# Patient Record
Sex: Male | Born: 1975 | Race: White | Hispanic: No | Marital: Single | Smoking: Current every day smoker
Health system: Southern US, Community
[De-identification: ages and names within clinical notes are randomized; demographics above are authoritative.]

---

## 2004-07-23 ENCOUNTER — Emergency Department: Payer: Self-pay | Admitting: Emergency Medicine

## 2004-11-04 ENCOUNTER — Other Ambulatory Visit: Payer: Self-pay

## 2004-11-04 ENCOUNTER — Emergency Department: Payer: Self-pay | Admitting: Emergency Medicine

## 2006-04-28 ENCOUNTER — Emergency Department: Payer: Self-pay | Admitting: Emergency Medicine

## 2006-11-11 ENCOUNTER — Inpatient Hospital Stay: Payer: Self-pay | Admitting: Psychiatry

## 2007-06-07 ENCOUNTER — Emergency Department: Payer: Self-pay | Admitting: Emergency Medicine

## 2008-07-22 ENCOUNTER — Emergency Department: Payer: Self-pay | Admitting: Emergency Medicine

## 2010-09-02 ENCOUNTER — Emergency Department: Payer: Self-pay | Admitting: Unknown Physician Specialty

## 2011-03-13 ENCOUNTER — Emergency Department: Payer: Self-pay | Admitting: Emergency Medicine

## 2011-05-22 ENCOUNTER — Emergency Department: Payer: Self-pay | Admitting: Emergency Medicine

## 2011-05-22 LAB — COMPREHENSIVE METABOLIC PANEL
Albumin: 4.6 g/dL (ref 3.4–5.0)
Alkaline Phosphatase: 46 U/L — ABNORMAL LOW (ref 50–136)
Anion Gap: 13 (ref 7–16)
BUN: 14 mg/dL (ref 7–18)
Calcium, Total: 8.7 mg/dL (ref 8.5–10.1)
Co2: 25 mmol/L (ref 21–32)
Creatinine: 0.84 mg/dL (ref 0.60–1.30)
EGFR (African American): >60
Glucose: 81 mg/dL (ref 65–99)
SGOT(AST): 31 U/L (ref 15–37)
SGPT (ALT): 21 U/L
Sodium: 145 mmol/L (ref 136–145)

## 2011-05-22 LAB — ETHANOL
Ethanol %: <0.003 % (ref 0.000–0.080)
Ethanol: <3 mg/dL

## 2011-05-22 LAB — CBC
HCT: 39 % — ABNORMAL LOW (ref 40.0–52.0)
HGB: 13.3 g/dL (ref 13.0–18.0)
MCHC: 34 g/dL (ref 32.0–36.0)
MCV: 95 fL (ref 80–100)
Platelet: 324 10*3/uL (ref 150–440)
WBC: 9.8 10*3/uL (ref 3.8–10.6)

## 2011-05-25 ENCOUNTER — Emergency Department: Payer: Self-pay | Admitting: Emergency Medicine

## 2011-05-25 LAB — TROPONIN I: Troponin-I: <0.02 ng/mL

## 2011-05-25 LAB — CBC
MCHC: 34.4 g/dL (ref 32.0–36.0)
MCV: 94 fL (ref 80–100)
Platelet: 299 10*3/uL (ref 150–440)
RBC: 3.89 10*6/uL — ABNORMAL LOW (ref 4.40–5.90)
WBC: 6.9 10*3/uL (ref 3.8–10.6)

## 2011-05-25 LAB — COMPREHENSIVE METABOLIC PANEL
Albumin: 4.1 g/dL (ref 3.4–5.0)
Calcium, Total: 8.5 mg/dL (ref 8.5–10.1)
Chloride: 106 mmol/L (ref 98–107)
Co2: 27 mmol/L (ref 21–32)
Creatinine: 0.82 mg/dL (ref 0.60–1.30)
EGFR (Non-African Amer.): >60
Glucose: 134 mg/dL — ABNORMAL HIGH (ref 65–99)
Osmolality: 286 (ref 275–301)
Potassium: 3.8 mmol/L (ref 3.5–5.1)
SGOT(AST): 30 U/L (ref 15–37)
SGPT (ALT): 21 U/L
Sodium: 143 mmol/L (ref 136–145)

## 2011-06-03 ENCOUNTER — Emergency Department: Payer: Self-pay | Admitting: Emergency Medicine

## 2011-07-04 ENCOUNTER — Emergency Department: Payer: Self-pay | Admitting: Emergency Medicine

## 2011-07-04 LAB — COMPREHENSIVE METABOLIC PANEL
Alkaline Phosphatase: 75 U/L (ref 50–136)
Calcium, Total: 9.1 mg/dL (ref 8.5–10.1)
Chloride: 107 mmol/L (ref 98–107)
Co2: 23 mmol/L (ref 21–32)
Glucose: 70 mg/dL (ref 65–99)
SGOT(AST): 34 U/L (ref 15–37)
SGPT (ALT): 24 U/L

## 2011-07-04 LAB — URINALYSIS, COMPLETE
Bilirubin,UR: NEGATIVE
Glucose,UR: NEGATIVE mg/dL (ref 0–75)
Protein: 30

## 2011-07-04 LAB — DRUG SCREEN, URINE
Barbiturates, Ur Screen: NEGATIVE (ref ?–200)
Cocaine Metabolite,Ur ~~LOC~~: NEGATIVE (ref ?–300)
MDMA (Ecstasy)Ur Screen: NEGATIVE (ref ?–500)
Methadone, Ur Screen: NEGATIVE (ref ?–300)
Phencyclidine (PCP) Ur S: NEGATIVE (ref ?–25)
Tricyclic, Ur Screen: NEGATIVE (ref ?–1000)

## 2011-07-04 LAB — ETHANOL: Ethanol: <3 mg/dL

## 2011-07-04 LAB — TSH: Thyroid Stimulating Horm: 0.75 u[IU]/mL

## 2011-07-05 LAB — CBC
MCH: 31.8 pg (ref 26.0–34.0)
MCHC: 33.8 g/dL (ref 32.0–36.0)
MCV: 94 fL (ref 80–100)
Platelet: 351 10*3/uL (ref 150–440)
RBC: 4.53 10*6/uL (ref 4.40–5.90)
WBC: 13.2 10*3/uL — ABNORMAL HIGH (ref 3.8–10.6)

## 2013-05-29 ENCOUNTER — Emergency Department: Payer: Self-pay | Admitting: Emergency Medicine

## 2014-06-23 NOTE — Consult Note (Signed)
Brief Consult Note: Diagnosis: Mood disorder NOS, PTSD.   Patient was seen by consultant.   Consult note dictated.   Recommend further assessment or treatment.   Orders entered.   Discussed with Attending MD.   Comments: [name] has a h/o anxiety and mood instability. He was brought to the hospital after a suicidal gesture by putting a toy gun to his head to impress his girlfriend who wants to separate. He is no longer suicidal, homicidal., agitated or excessively anxious. He is able to contract for safety.  PLAN: 1. The patient no longer meets criteria for IVC. I will termonate proceedings. Please discharge as appropriate.   2. I will start Tegretol for mood stabilization, Trazodone for sleep and BuSpar for anxiety as it was helpful in the past. Rx written.  3. [facility] Nevin BloodgoodKathy Swansie.  Electronic Signatures: Kristine LineaPucilowska, Latrina Guttman (MD)  (Signed 06-May-13 11:00)  Authored: Brief Consult Note   Last Updated: 06-May-13 11:00 by Kristine LineaPucilowska, Antoniette Peake (MD)

## 2016-03-10 ENCOUNTER — Emergency Department
Admission: EM | Admit: 2016-03-10 | Discharge: 2016-03-10 | Discharge status: Against Medical Advice | Payer: Self-pay | Attending: Student in an Organized Health Care Education/Training Program | Admitting: Student in an Organized Health Care Education/Training Program

## 2016-03-10 ENCOUNTER — Encounter: Payer: Self-pay | Admitting: Emergency Medicine

## 2016-03-10 ENCOUNTER — Emergency Department: Payer: Self-pay

## 2016-03-10 DIAGNOSIS — I959 Hypotension, unspecified: Secondary | ICD-10-CM | POA: Insufficient documentation

## 2016-03-10 DIAGNOSIS — R519 Headache, unspecified: Secondary | ICD-10-CM

## 2016-03-10 DIAGNOSIS — F17210 Nicotine dependence, cigarettes, uncomplicated: Secondary | ICD-10-CM | POA: Insufficient documentation

## 2016-03-10 DIAGNOSIS — R569 Unspecified convulsions: Secondary | ICD-10-CM | POA: Insufficient documentation

## 2016-03-10 DIAGNOSIS — R51 Headache: Secondary | ICD-10-CM | POA: Insufficient documentation

## 2016-03-10 LAB — BASIC METABOLIC PANEL
Anion gap: 7 (ref 5–15)
BUN: 18 mg/dL (ref 6–20)
CALCIUM: 9.3 mg/dL (ref 8.9–10.3)
CO2: 27 mmol/L (ref 22–32)
Chloride: 104 mmol/L (ref 101–111)
Creatinine, Ser: 1.2 mg/dL (ref 0.61–1.24)
GFR calc Af Amer: >60 mL/min (ref >60)
GLUCOSE: 149 mg/dL — AB (ref 65–99)
POTASSIUM: 3.9 mmol/L (ref 3.5–5.1)
Sodium: 138 mmol/L (ref 135–145)

## 2016-03-10 LAB — CBC
HEMATOCRIT: 40.8 % (ref 40.0–52.0)
Hemoglobin: 14 g/dL (ref 13.0–18.0)
MCH: 30.7 pg (ref 26.0–34.0)
MCHC: 34.4 g/dL (ref 32.0–36.0)
MCV: 89.3 fL (ref 80.0–100.0)
Platelets: 384 10*3/uL (ref 150–440)
RBC: 4.57 MIL/uL (ref 4.40–5.90)
RDW: 13.3 % (ref 11.5–14.5)
WBC: 25.5 10*3/uL — ABNORMAL HIGH (ref 3.8–10.6)

## 2016-03-10 MED ORDER — SODIUM CHLORIDE 0.9 % IV BOLUS (SEPSIS): 1000.0000 mL | Freq: Once | INTRAVENOUS | Status: DC

## 2016-03-10 MED ORDER — KETAMINE HCL 10 MG/ML IJ SOLN: 0.2000 mg/kg | Freq: Once | INTRAMUSCULAR | Status: DC

## 2016-03-10 MED ORDER — LEVOFLOXACIN 750 MG PO TABS: 750.0000 mg | ORAL_TABLET | Freq: Every day | ORAL | 0 refills | Status: AC

## 2016-03-10 MED ORDER — SODIUM CHLORIDE 0.9 % IV BOLUS (SEPSIS)
1000.0000 mL | Freq: Once | INTRAVENOUS | Status: AC
  Administered 2016-03-10: 1000 mL via INTRAVENOUS

## 2016-03-10 MED ORDER — BUPIVACAINE HCL 0.25 % IJ SOLN
30.0000 mL | Freq: Once | INTRAMUSCULAR | Status: DC
  Filled 2016-03-10: qty 30

## 2016-03-10 MED ORDER — ACETAMINOPHEN 500 MG PO TABS
1000.0000 mg | ORAL_TABLET | Freq: Once | ORAL | Status: AC
  Administered 2016-03-10: 1000 mg via ORAL
  Filled 2016-03-10: qty 2

## 2016-03-10 MED ORDER — FENTANYL CITRATE (PF) 100 MCG/2ML IJ SOLN: 100.0000 ug | INTRAMUSCULAR | Status: DC | PRN

## 2016-03-10 MED ORDER — LEVOFLOXACIN 750 MG PO TABS
750.0000 mg | ORAL_TABLET | Freq: Once | ORAL | Status: AC
  Administered 2016-03-10: 750 mg via ORAL
  Filled 2016-03-10: qty 1

## 2016-03-10 MED ORDER — PROCHLORPERAZINE EDISYLATE 5 MG/ML IJ SOLN
10.0000 mg | Freq: Once | INTRAMUSCULAR | Status: AC
  Administered 2016-03-10: 10 mg via INTRAVENOUS
  Filled 2016-03-10: qty 2

## 2016-03-10 MED ORDER — BUPIVACAINE HCL (PF) 0.25 % IJ SOLN
INTRAMUSCULAR | Status: AC
  Filled 2016-03-10: qty 30

## 2016-03-10 NOTE — Discharge Instructions (Signed)
Please return if you change your mind regarding desire for medical care.  Please fololow up with PCP.    As discussed in the emergency department, you may use Tylenol and/or Ibuprofen for headaches. These are "Over the Counter" medications and can be found at most drug stores and grocery stores. Please use the recommended dosing instructions on the bottle/box. Do not exceed the maximum dose for either medications. Please be sure to rest and drink plenty of fluids. Please be sure to call your PCP for a follow-up visit, especially if your headaches persist.  Please call your physician or return to ED if you have: 1. Worsening or change in headaches. 2. Changes in vision. 3. New-onset nausea and vomiting. 4. Numbness, tingling, weakness in your extremities,. 4. Inability to eat or drink adequate amounts of food or liquids. 5. Chest pain, shortness of breath, or difficulty breathing. 6. Neurological changes- dizziness, fainting, loss of function of your arms, legs or other parts of your body. 7. Uncontrolled hypertension. 8. Or any other emergent concerns.

## 2016-03-10 NOTE — ED Triage Notes (Addendum)
Pt to triage via w/c, appears uncomfortable, c/o generalized HA; denies any recent illness; st had some dizziness today; wife re ports pt was driving, had to pull over and wife st he got out of care, fell down with ?seizure like activity; denies hx of same; st hx testicular CA; st his BP was 80 systolic; charge nurse notified and pt taken immed to room 7 by Kennedy Buckerhanh, EDT for further evaluation

## 2016-03-10 NOTE — ED Notes (Signed)
Pt verbalized desire to leave AMA. Risks described in detail to pt. Pt weak at this time and able to ambulate but continues to have hypotension.

## 2016-03-10 NOTE — ED Notes (Signed)
Pt currently refusing flu swab at this time. MD made aware.

## 2016-03-10 NOTE — ED Provider Notes (Signed)
[facility] Emergency Department Provider Note    None    (approximate)  I have reviewed the triage vital signs and the nursing notes.   HISTORY  Chief Complaint Headache and Seizures    HPI Stuart Allen is a 41 y.o. male presents ill appearing with complaint of generalized headache as well as some dizziness earlier today. According to the wife there riding home tonight and he started feeling ill. Reported over on the side of the road. Heaving out of a car and had some seizure-like activity reported by the wife. His eyes rolled back in the back of his head and he had diffuse shaking activity for several seconds followed by drowsiness. They went home and the patient was refusing transfer to the hospital.  Wife was finally able to convince him to come. Upon arrival to the ER he is ill-appearing and poorly perfused. The pressure is hypotensive. States his primary complaint is sudden onset headache after the seizure-like activity. He denies any history of seizures. States she has a history of chronic back pain. Denies any recent drug use. Recent denies any fevers. No chills. No nausea or vomiting.   History reviewed. No pertinent past medical history. FMH: no bleeding disorders History reviewed. No pertinent surgical history. There are no active problems to display for this patient.     Prior to Admission medications   Not on File    Allergies Peanut-containing drug products and Vicodin [hydrocodone-acetaminophen]    Social History Social History  Substance Use Topics  . Smoking status: Current Every Day Smoker    Packs/day: 1.00    Types: Cigarettes  . Smokeless tobacco: Never Used  . Alcohol use No    Review of Systems Patient denies headaches, rhinorrhea, blurry vision, numbness, shortness of breath, chest pain, edema, cough, abdominal pain, nausea, vomiting, diarrhea, dysuria, fevers, rashes or hallucinations unless otherwise stated  above in HPI. ____________________________________________   PHYSICAL EXAM:  VITAL SIGNS: Vitals:   03/10/16 2149 03/10/16 2200  BP: (!) 93/53 (!) 92/39  Pulse: 63 78  Resp: 17 17  Temp:      Constitutional: Alert and oriented. Ill appearing. Eyes: Conjunctivae are normal. PERRL. EOMI. Head: Atraumatic. Nose: No congestion/rhinnorhea. Mouth/Throat: Mucous membranes are moist.  Oropharynx non-erythematous. Neck: No stridor. Painless ROM. No cervical spine tenderness to palpation Hematological/Lymphatic/Immunilogical: No cervical lymphadenopathy. Cardiovascular: Normal rate, regular rhythm. Grossly normal heart sounds.  Good peripheral circulation. Respiratory: Normal respiratory effort.  No retractions. Lungs CTAB. Gastrointestinal: Soft and nontender. No distention. No abdominal bruits. No CVA tenderness. Musculoskeletal: No lower extremity tenderness nor edema.  No joint effusions. Neurologic:  CN- intact.  No facial droop, Normal FNF.  Normal heel to shin.  Sensation intact bilaterally. Normal speech and language. No gross focal neurologic deficits are appreciated. No gait instability.  Skin:  Skin is warm, dry and intact. No rash noted. Psychiatric: Mood and affect are normal. Speech and behavior are normal.  ____________________________________________   LABS (all labs ordered are listed, but only abnormal results are displayed)  Results for orders placed or performed during the hospital encounter of 03/10/16 (from the past 24 hour(s))  Basic metabolic panel     Status: Abnormal   Collection Time: 03/10/16  8:52 PM  Result Value Ref Range   Sodium 138 135 - 145 mmol/L   Potassium 3.9 3.5 - 5.1 mmol/L   Chloride 104 101 - 111 mmol/L   CO2 27 22 - 32 mmol/L   Glucose, Bld  149 (H) 65 - 99 mg/dL   BUN 18 6 - 20 mg/dL   Creatinine, Ser 1.61 0.61 - 1.24 mg/dL   Calcium 9.3 8.9 - 09.6 mg/dL   GFR calc non Af Amer >60 >60 mL/min   GFR calc Af Amer >60 >60 mL/min   Anion  gap 7 5 - 15  CBC     Status: Abnormal   Collection Time: 03/10/16  8:52 PM  Result Value Ref Range   WBC 25.5 (H) 3.8 - 10.6 K/uL   RBC 4.57 4.40 - 5.90 MIL/uL   Hemoglobin 14.0 13.0 - 18.0 g/dL   HCT 04.5 40.9 - 81.1 %   MCV 89.3 80.0 - 100.0 fL   MCH 30.7 26.0 - 34.0 pg   MCHC 34.4 32.0 - 36.0 g/dL   RDW 91.4 78.2 - 95.6 %   Platelets 384 150 - 440 K/uL   ____________________________________________  EKG My review and personal interpretation at Time: 20:43   Indication: hypotension  Rate: 75  Rhythm: sinus Axis:  Other: non specific st changes, no STEMI ____________________________________________  RADIOLOGY  I personally reviewed all radiographic images ordered to evaluate for the above acute complaints and reviewed radiology reports and findings.  These findings were personally discussed with the patient.  Please see medical record for radiology report. ____________________________________________   PROCEDURES  Procedure(s) performed:  Procedures    Critical Care performed:  no ____________________________________________   INITIAL IMPRESSION / ASSESSMENT AND PLAN / ED COURSE  Pertinent labs & imaging results that were available during my care of the patient were reviewed by me and considered in my medical decision making (see chart for details).  DDX: sepsis, flu, meningitis, seizure, sah, sdh,  Stuart Allen is a 41 y.o. who presents to the ED with above complaints. Patient afebrile but hypertensive and ill-appearing. Does not appear to have a history of seizures. Blood work ordered to evaluate for his complaints shows markedly elevated leukocytosis but there is no metabolic acidosis. CT head ordered to evaluate for acute abnormality shows no evidence of hemorrhage or mass. No evidence of trauma. EKG shows no evidence of acute ischemia. Giving patient IV fluids and he does appear to be improving but still persistent decreased blood pressure. Have also  ordered a UDS. Discussed my concern for meningitis with the patient versus subarachnoid hemorrhage as this is the worst headache of his life. Denies any neck pain at this time and still afebrile but presentation is very bizarre. In does stated understanding of the risks and benefits of performing a lumbar puncture. He agreed to receive this diagnostic test.  In preparation for lumbar puncture to provide adequate analgesia by cleaned and then infiltrated 10 cc of Marcaine into the L3-L4 lumbar spinal space. Patient stated "that is the worst pain of over fell in my life" and told me to stop.  Clinical Course as of Mar 10 2320  Wed Mar 10, 2016  2316 I informed patient that I would be provided patient with IV pain medication and introduced into ketamine prior to attempting a lumbar puncture. Patient states that he no longer wants to be in the ER and does not want any further medical treatment at this time. Patient demonstrates understanding that failure to treat undiagnosed this condition could result and death. Patient ripped out his IV and is standing up. He is with his wife. He demonstrates good insight. Stop process does appear clear. He demonstrates understanding that leaving at this point with the concern for  meningitis or infectious process could result and death and worsening of his condition and that it is leaving AGAINST MEDICAL ADVICE. ED agree to stay for repeat vitals. He is able to ambulate and his blood pressure has improved. He is still afebrile. They saw concern for sepsis. The patient on Levaquin. Have encouraged the patient to return to the ER as soon as possible.  [PR]    Clinical Course User Index [PR] Willy Eddy, MD     ____________________________________________   FINAL CLINICAL IMPRESSION(S) / ED DIAGNOSES  Final diagnoses:  Seizure (HCC)  Acute nonintractable headache, unspecified headache type  Hypotension, unspecified hypotension type      NEW MEDICATIONS  STARTED DURING THIS VISIT:  New Prescriptions   No medications on file     Note:  This document was prepared using Dragon voice recognition software and may include unintentional dictation errors.    Willy Eddy, MD 03/10/16 [phone]

## 2016-03-10 NOTE — ED Notes (Signed)
Pt informed a urine sample is needed at this time. Sample cup provided. RN also called pharmacy and requested

## 2016-03-10 NOTE — ED Notes (Addendum)
PT taken to CT and Xray at this time.

## 2017-06-21 IMAGING — CT CT HEAD W/O CM
3 series · 16 of 46 positions shown, 19 images · non-contrast
Comparison: 05/22/2011

CLINICAL DATA: Generalized headache and dizziness

EXAM:
CT HEAD WITHOUT CONTRAST
TECHNIQUE: Contiguous axial images were obtained from the base of the skull
through the vertex without intravenous contrast.

[Series 3: head wo · axial · 0.42mm/px · z∈[+287,+407]mm · 10 of 29 slices shown, 13 images]
[im 3/29  brain]
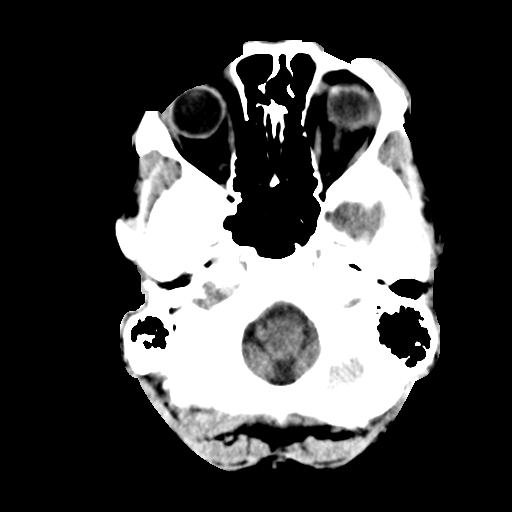
[im 3/29  bone]
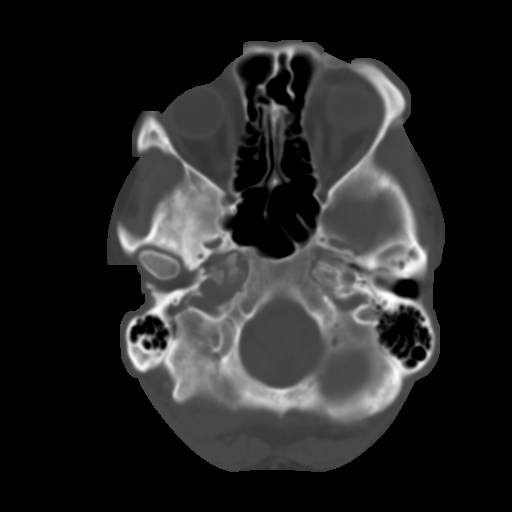
[im 6/29  brain]
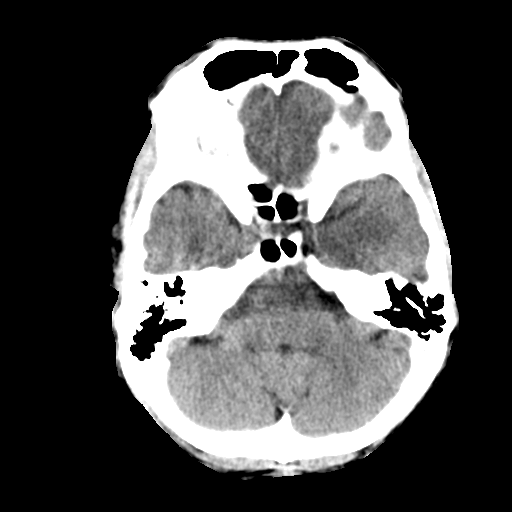
[im 8/29  brain]
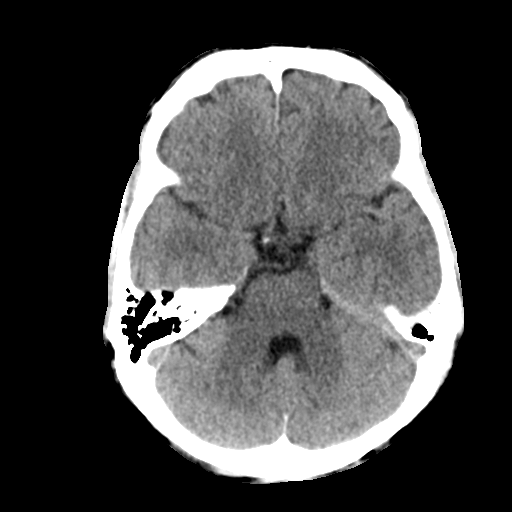
[im 11/29  brain]
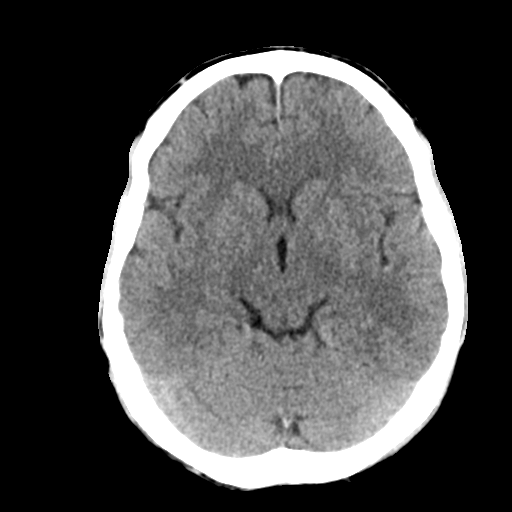
[im 14/29  brain]
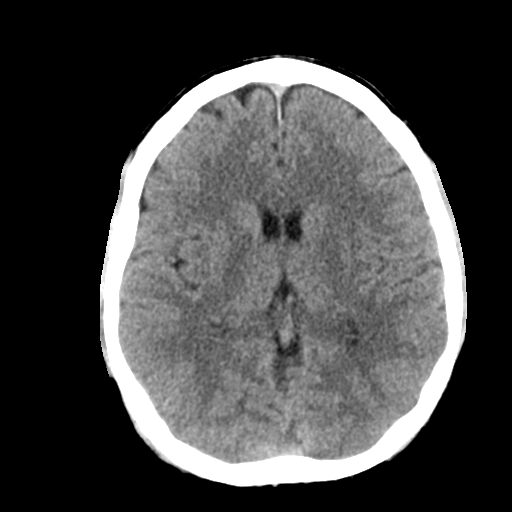
[im 14/29  bone]
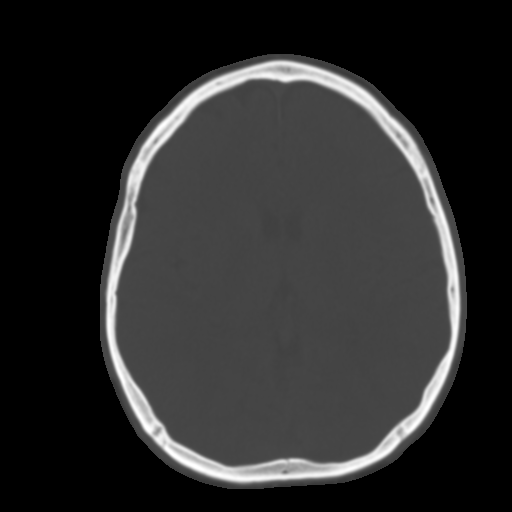
[im 16/29  brain]
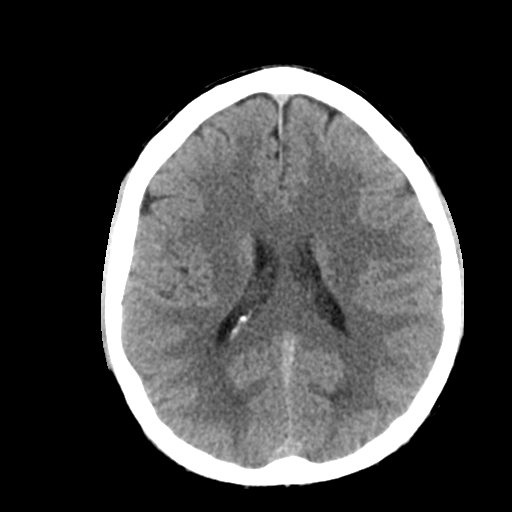
[im 19/29  brain]
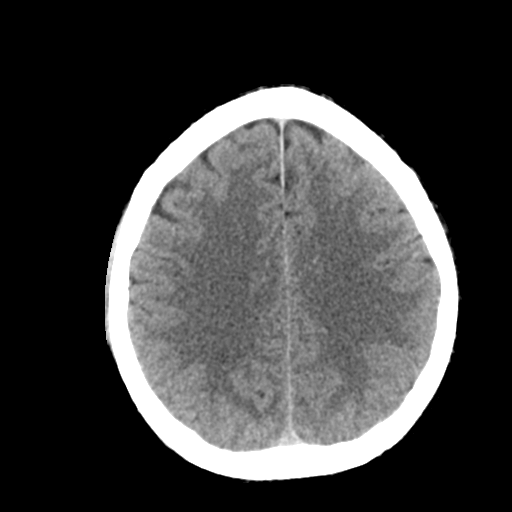
[im 22/29  brain]
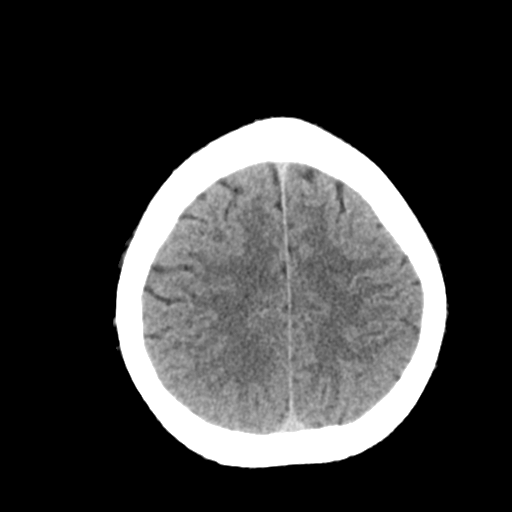
[im 24/29  brain]
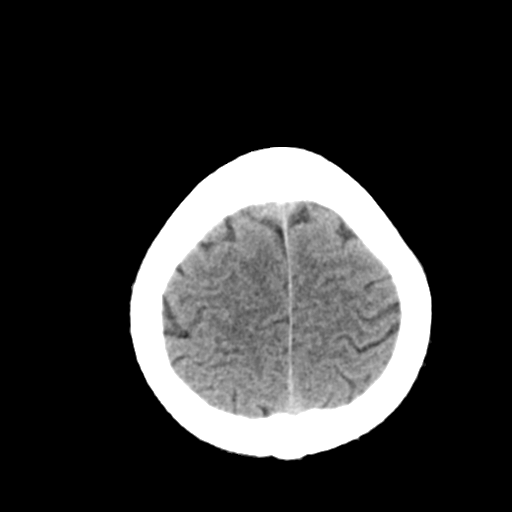
[im 24/29  bone]
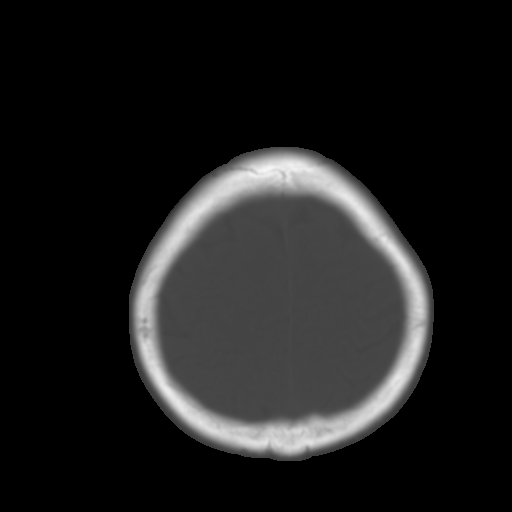
[im 27/29  brain]
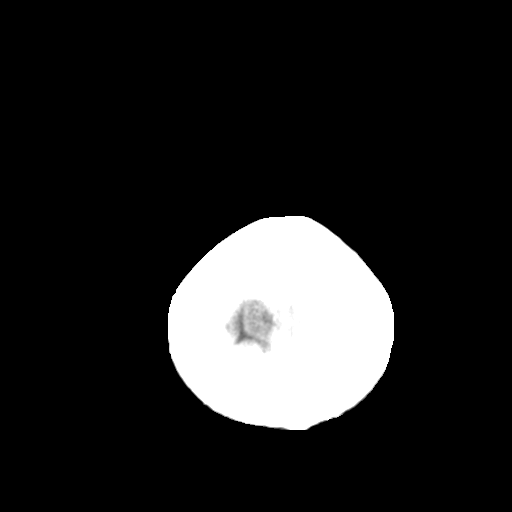

[Series 4: coronal soft tissue · coronal · 0.29mm/px · 3 of 60 slices shown]
[im 20/60  brain]
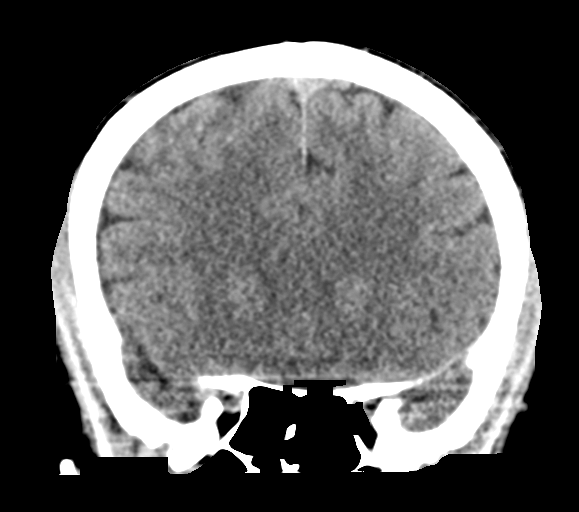
[im 27/60  brain]
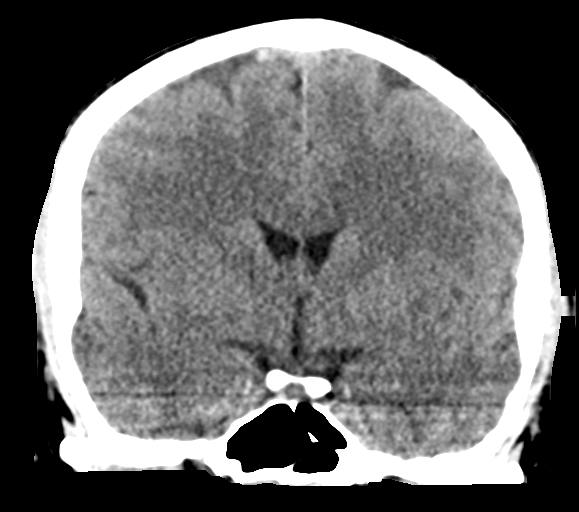
[im 33/60  brain]
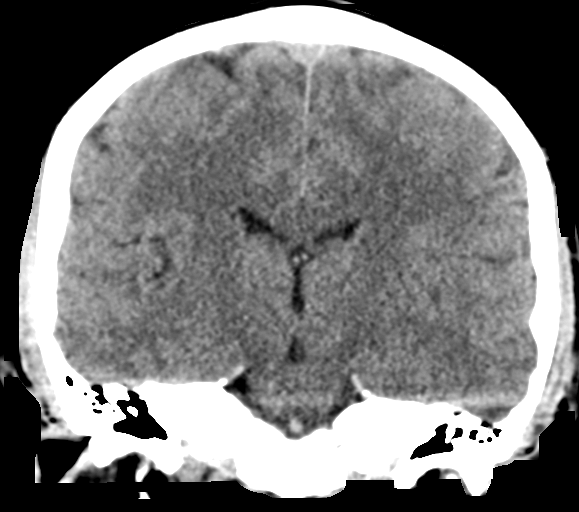

[Series 5: sagittal soft tissue · sagittal · 0.30mm/px · 3 of 54 slices shown]
[im 18/54  brain]
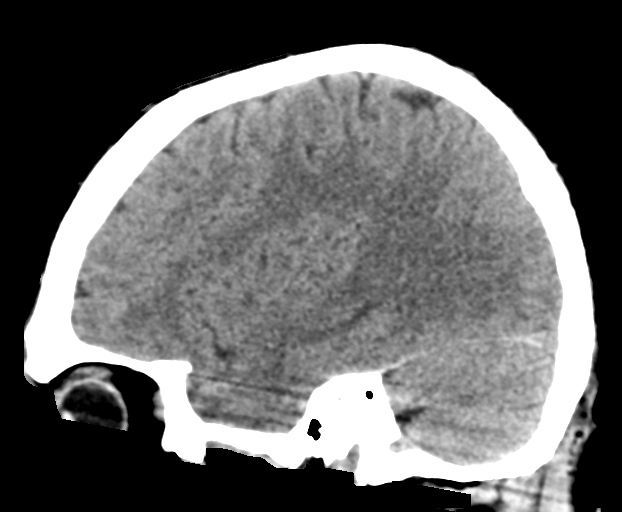
[im 27/54  brain]
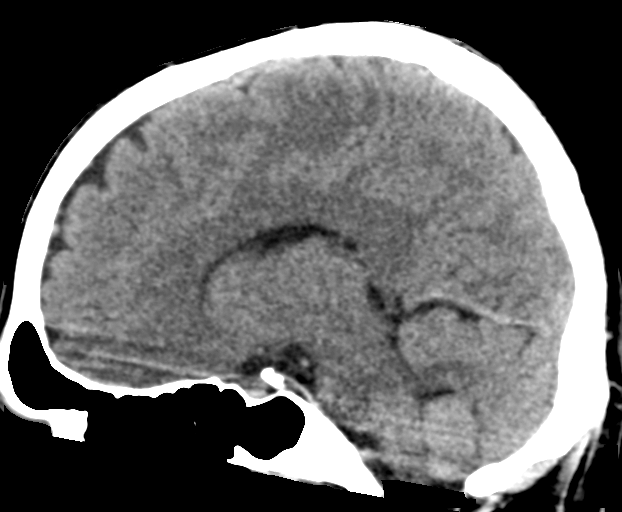
[im 36/54  brain]
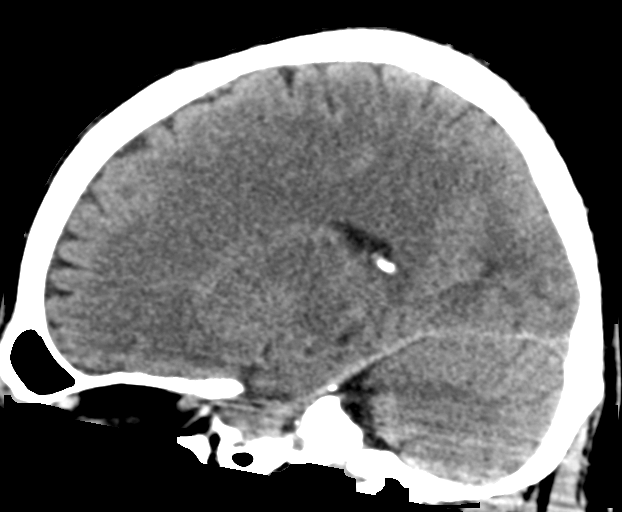

[16 of 46 positions shown; findings below may reference images not displayed]

FINDINGS: Brain: No evidence of acute infarction, hemorrhage, hydrocephalus,
extra-axial collection or mass lesion/mass effect.

Vascular: No hyperdense vessel or unexpected calcification.

Skull: Normal. Negative for fracture or focal lesion.

Sinuses/Orbits: No acute finding.

Other: None
IMPRESSION: No CT evidence for acute intracranial abnormality

## 2021-07-30 DEATH — deceased
